# Patient Record
Sex: Female | Born: 1950 | Race: White | Hispanic: No | Marital: Married | Smoking: Never smoker
Health system: Southern US, Community
[De-identification: ages and names within clinical notes are randomized; demographics above are authoritative.]

---

## 2018-06-06 ENCOUNTER — Other Ambulatory Visit: Payer: Self-pay

## 2018-06-06 ENCOUNTER — Encounter: Payer: Self-pay | Admitting: Emergency Medicine

## 2018-06-06 ENCOUNTER — Emergency Department (INDEPENDENT_AMBULATORY_CARE_PROVIDER_SITE_OTHER)
Admission: EM | Admit: 2018-06-06 | Discharge: 2018-06-06 | Disposition: A | Discharge status: Home / Self Care | Payer: Medicare HMO | Source: Home / Self Care | Attending: Family Medicine | Admitting: Family Medicine

## 2018-06-06 ENCOUNTER — Emergency Department (INDEPENDENT_AMBULATORY_CARE_PROVIDER_SITE_OTHER): Payer: Medicare HMO

## 2018-06-06 DIAGNOSIS — M1711 Unilateral primary osteoarthritis, right knee: Secondary | ICD-10-CM

## 2018-06-06 DIAGNOSIS — M7701 Medial epicondylitis, right elbow: Secondary | ICD-10-CM

## 2018-06-06 DIAGNOSIS — M25521 Pain in right elbow: Secondary | ICD-10-CM

## 2018-06-06 DIAGNOSIS — M25561 Pain in right knee: Secondary | ICD-10-CM | POA: Diagnosis not present

## 2018-06-06 MED ORDER — MELOXICAM 7.5 MG PO TABS: 7.5000 mg | ORAL_TABLET | Freq: Every day | ORAL | 0 refills | Status: DC

## 2018-06-06 NOTE — ED Provider Notes (Signed)
[facility]    CSN: [id number] Arrival date & time: 06/06/18  1543     History   Chief Complaint Chief Complaint  Patient presents with  . Knee Pain  . Elbow Pain    HPI Deanna Keith is a 67 y.o. female.   HPI Deanna Keith is a 67 y.o. female presenting to UC with c/o Right elbow and Right knee pain for about 1 month. Aching and sore, mild to moderate in severity.  Knee pain worse with weightbearing.  No known injury.  She is Right hand dominant.  She has used ice and ibuprofen with mild temporary relief.  Denies redness or swelling at joints.    History reviewed. No pertinent past medical history.  There are no active problems to display for this patient.   History reviewed. No pertinent surgical history.  OB History   None      Home Medications    Prior to Admission medications   Medication Sig Start Date End Date Taking? Authorizing Provider  meloxicam (MOBIC) 7.5 MG tablet Take 1-2 tablets (7.5-15 mg total) by mouth daily. 06/06/18   Lurene ShadowPhelps, Iness Pangilinan O, PA-C    Family History No family history on file.  Social History Social History   Tobacco Use  . Smoking status: Never Smoker  . Smokeless tobacco: Never Used  Substance Use Topics  . Alcohol use: Not Currently  . Drug use: Not on file     Allergies   Patient has no known allergies.   Review of Systems Review of Systems  Musculoskeletal: Positive for arthralgias and myalgias. Negative for joint swelling.  Skin: Negative for color change, rash and wound.  Neurological: Negative for weakness and numbness.     Physical Exam Triage Vital Signs ED Triage Vitals  Enc Vitals Group     BP 06/06/18 1614 134/79     Pulse Rate 06/06/18 1614 72     Resp 06/06/18 1614 18     Temp 06/06/18 1614 97.8 F (36.6 C)     Temp Source 06/06/18 1614 Oral     SpO2 06/06/18 1614 98 %     Weight 06/06/18 1615 190 lb (86.2 kg)     Height 06/06/18 1615 5\' 6"  (1.676 m)     Head Circumference --    Peak Flow --      Pain Score 06/06/18 1614 4     Pain Loc --      Pain Edu? --      Excl. in GC? --    No data found.  Updated Vital Signs BP 134/79   Pulse 72   Temp 97.8 F (36.6 C) (Oral)   Resp 18   Ht 5\' 6"  (1.676 m)   Wt 190 lb (86.2 kg)   SpO2 98%   BMI 30.67 kg/m   Visual Acuity Right Eye Distance:   Left Eye Distance:   Bilateral Distance:    Right Eye Near:   Left Eye Near:    Bilateral Near:     Physical Exam  Constitutional: She is oriented to person, place, and time. She appears well-developed and well-nourished. No distress.  HENT:  Head: Normocephalic and atraumatic.  Eyes: EOM are normal.  Neck: Normal range of motion.  Cardiovascular: Normal rate.  Pulses:      Radial pulses are 2+ on the right side.  Pulmonary/Chest: Effort normal.  Musculoskeletal: Normal range of motion. She exhibits tenderness. She exhibits no edema.  Right elbow: no obvious edema or deformity.  Full ROM. Tenderness over medial epicondyle.  Right knee: no obvious edema or deformity. Tenderness to medial aspect.  Full ROM, no crepitus.   Neurological: She is alert and oriented to person, place, and time.  Skin: Skin is warm and dry. Capillary refill takes less than 2 seconds. She is not diaphoretic.  Right elbow and knee: skin in tact. No ecchymosis or erytehma.   Psychiatric: She has a normal mood and affect. Her behavior is normal.  Nursing note and vitals reviewed.    UC Treatments / Results  Labs (all labs ordered are listed, but only abnormal results are displayed) Labs Reviewed - No data to display  EKG None  Radiology Dg Elbow Complete Right (3+view)  Result Date: 06/06/2018 CLINICAL DATA:  Right elbow pain after injury several months ago. EXAM: RIGHT ELBOW - COMPLETE 3+ VIEW COMPARISON:  None. FINDINGS: There is no evidence of fracture, dislocation, or joint effusion. There is no evidence of arthropathy or other focal bone abnormality. Soft tissues are  unremarkable. IMPRESSION: Normal right elbow. Electronically Signed   By: Lupita Raider, M.D.   On: 06/06/2018 17:14   Dg Knee Complete 4 Views Right  Result Date: 06/06/2018 CLINICAL DATA:  Right knee pain after injury several months ago. EXAM: RIGHT KNEE - COMPLETE 4+ VIEW COMPARISON:  None. FINDINGS: No evidence of fracture, dislocation, or joint effusion. Mild narrowing of medial joint space is noted. Soft tissues are unremarkable. IMPRESSION: Mild degenerative joint disease is noted medially. No acute abnormality seen in the right knee. Electronically Signed   By: Lupita Raider, M.D.   On: 06/06/2018 17:16    Procedures Procedures (including critical care time)  Medications Ordered in UC Medications - No data to display  Initial Impression / Assessment and Plan / UC Course  I have reviewed the triage vital signs and the nursing notes.  Pertinent labs & imaging results that were available during my care of the patient were reviewed by me and considered in my medical decision making (see chart for details).     Discussed imaging with pt. Pain likely due to mild arthritis  Arm strap for elbow and knee sleeve provided for comfort.  Final Clinical Impressions(s) / UC Diagnoses   Final diagnoses:  Right elbow pain  Medial epicondylitis of elbow, right  Osteoarthritis of right knee, unspecified osteoarthritis type     Discharge Instructions      Meloxicam (Mobic) is an antiinflammatory to help with pain and inflammation.  Do not take ibuprofen, Advil, Aleve, or any other medications that contain NSAIDs while taking meloxicam as this may cause stomach upset or even ulcers if taken in large amounts for an extended period of time.   Please call to schedule a follow up appointment with Sports Medicine in 1-2 weeks if not improving, sooner if worsening.     ED Prescriptions    Medication Sig Dispense Auth. Provider   meloxicam (MOBIC) 7.5 MG tablet Take 1-2 tablets (7.5-15 mg  total) by mouth daily. 20 tablet Lurene Shadow, PA-C     Controlled Substance Prescriptions [place] Controlled Substance Registry consulted? Not Applicable   Rolla Plate 06/07/18 3664

## 2018-06-06 NOTE — Discharge Instructions (Signed)
°  Meloxicam (Mobic) is an antiinflammatory to help with pain and inflammation.  Do not take ibuprofen, Advil, Aleve, or any other medications that contain NSAIDs while taking meloxicam as this may cause stomach upset or even ulcers if taken in large amounts for an extended period of time.   Please call to schedule a follow up appointment with Sports Medicine in 1-2 weeks if not improving, sooner if worsening.

## 2018-06-06 NOTE — ED Triage Notes (Signed)
Patient reports pain in right knee and right elbow; uncertain of start date or specific injury but guesses it's been about a month. Takes ibuprofen and has tried icing.

## 2018-06-08 ENCOUNTER — Telehealth: Payer: Self-pay | Admitting: Emergency Medicine

## 2018-06-08 NOTE — Telephone Encounter (Signed)
Attempted to contact patient, left a message on voicemail.

## 2018-06-13 ENCOUNTER — Ambulatory Visit (INDEPENDENT_AMBULATORY_CARE_PROVIDER_SITE_OTHER): Payer: Medicare HMO | Admitting: Family Medicine

## 2018-06-13 ENCOUNTER — Encounter: Payer: Self-pay | Admitting: Family Medicine

## 2018-06-13 DIAGNOSIS — M7701 Medial epicondylitis, right elbow: Secondary | ICD-10-CM

## 2018-06-13 DIAGNOSIS — M179 Osteoarthritis of knee, unspecified: Secondary | ICD-10-CM | POA: Insufficient documentation

## 2018-06-13 DIAGNOSIS — M1710 Unilateral primary osteoarthritis, unspecified knee: Secondary | ICD-10-CM | POA: Insufficient documentation

## 2018-06-13 DIAGNOSIS — M25561 Pain in right knee: Secondary | ICD-10-CM

## 2018-06-13 DIAGNOSIS — M1711 Unilateral primary osteoarthritis, right knee: Secondary | ICD-10-CM

## 2018-06-13 MED ORDER — DICLOFENAC SODIUM 1 % TD GEL: 4.0000 g | Freq: Four times a day (QID) | TRANSDERMAL | 11 refills | Status: DC

## 2018-06-13 NOTE — Patient Instructions (Signed)
Thank you for coming in today. Use the diclofenac gel on the knees 4x daily Do the straight leg raises and use an exercise bike. 30 2x daily Wean off of meloxicam   For the elbow do the elbow straight hand pull down stretch Do the elbow straight hand weight up to down slowly exercises  30 2x daily  Recheck with me in 6 weeks.  Return sooner if needed.

## 2018-06-13 NOTE — Progress Notes (Signed)
Subjective:    CC: Knee pain and elbow pain  HPI: Ms. Deanna Keith has right medial elbow pain and bilateral knee pain right worse than left.  Right elbow pain: Present for about 1 month.  Pain located at the medial aspect of the elbow and worse with wrist activity.  She is been doing more gardening recently and thinks it may be the cause.  She denies any injury.  She denies any radiating pain weakness or numbness.  She was seen in urgent care and was prescribed meloxicam which has helped a bit.  Additionally she was given a counterforce brace which she notes is not very helpful.  Patient has been placed in the counterforce brace directly over her medial epicondyle.  Additionally she is tried some ibuprofen which did not help much at all.  Bilateral knee pain right worse than left.  Pain ongoing for about 2 months.  Pain has occurred without injury.  She points to her medial knee bilaterally in her right anterior knee as the location of the majority of her pain.  She denies any locking or catching.  She does occasionally note popping.  She denies significant swelling.  Again she is tried meloxicam and ibuprofen which helped a bit.  She had x-rays in urgent care which did not show fracture but did show mild amount of arthritis in her right knee.  Past medical history, Surgical history, Family history not pertinant except as noted below, Social history, Allergies, and medications have been entered into the medical record, reviewed, and no changes needed.   Review of Systems: No headache, visual changes, nausea, vomiting, diarrhea, constipation, dizziness, abdominal pain, skin rash, fevers, chills, night sweats, weight loss, swollen lymph nodes, body aches, joint swelling, muscle aches, chest pain, shortness of breath, mood changes, visual or auditory hallucinations.   Objective:    Vitals:   06/13/18 1012  BP: (!) 143/64  Pulse: 68   General: Well Developed, well nourished, and in no acute  distress.  Neuro/Psych: Alert and oriented x3, extra-ocular muscles intact, able to move all 4 extremities, sensation grossly intact. Skin: Warm and dry, no rashes noted.  Respiratory: Not using accessory muscles, speaking in full sentences, trachea midline.  Cardiovascular: Pulses palpable, no extremity edema. Abdomen: Does not appear distended. MSK:  Right elbow normal-appearing no deformity normal motion. Mildly tender to palpation medial epicondyle.  Pain with resisted wrist flexion.  Pain with resisted wrist pronation. Normal elbow flexion and extension range of motion and strength.  Left elbow normal-appearing nontender normal motion normal strength.  Right knee: Normal-appearing without effusion or erythema. Range of motion 0-120 degrees with minimal retropatellar crepitations. Mild degenerative palpation medial joint line and at the distal patella tendon. Stable ligaments exam to anterior posterior drawer test and valgus varus stress test. Mildly positive medial McMurray's test. Extension strength slightly decreased 4/5 limited by pain. Flexion strength intact  Left knee: Normal appearing.  ROM 0-120 deg with mild crepitations.  TTP medial joint line mildly. Stable ligament exam.  Negative McMurray test.  Intact flexion and extension strength without pain.   Pulses and cap refill intact BL LE   Lab and Radiology Results EXAM: RIGHT KNEE - COMPLETE 4+ VIEW  COMPARISON:  None.  FINDINGS: No evidence of fracture, dislocation, or joint effusion. Mild narrowing of medial joint space is noted. Soft tissues are unremarkable.  IMPRESSION: Mild degenerative joint disease is noted medially. No acute abnormality seen in the right knee.   Electronically Signed  By: Lupita Raider, M.D.   On: 06/06/2018 17:16  EXAM: RIGHT ELBOW - COMPLETE 3+ VIEW  COMPARISON:  None.  FINDINGS: There is no evidence of fracture, dislocation, or joint effusion. There is no  evidence of arthropathy or other focal bone abnormality. Soft tissues are unremarkable.  IMPRESSION: Normal right elbow.   Electronically Signed   By: Lupita Raider, M.D.   On: 06/06/2018 17:14 I personally (independently) visualized and performed the interpretation of the images attached in this note.   Impression and Recommendations:    Assessment and Plan: 67 y.o. female with  Right elbow pain: Medial epicondylitis.  Plan for home exercise program improved positioning of counterforce brace over muscle belly not over epicondyle and diclofenac gel.  Recheck in 6 weeks.  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  Plan for quadricep strengthening, and diclofenac gel trial.  If not improving next step would be steroid injection.  Recheck in 6 weeks.  Return sooner if needed..   No orders of the defined types were placed in this encounter.  Meds ordered this encounter  Medications  . diclofenac sodium (VOLTAREN) 1 % GEL    Sig: Apply 4 g topically 4 (four) times daily. To knees bilaterally    Dispense:  400 g    Refill:  11    Patient tried and failed ibuprofen and meloxicam. Djd knees    Discussed warning signs or symptoms. Please see discharge instructions. Patient expresses understanding.

## 2018-07-03 ENCOUNTER — Telehealth: Payer: Self-pay

## 2018-07-03 ENCOUNTER — Telehealth: Payer: Self-pay | Admitting: Family Medicine

## 2018-07-03 NOTE — Telephone Encounter (Signed)
We will complete paperwork once we receive it.  In the meantime patient can purchase this herself using a good Rx coupon.  This should be $22 a tube at [facility].

## 2018-07-03 NOTE — Telephone Encounter (Signed)
Patient came in to inquire about the PA on her diclofenac. I explained the PA process to her, and she stated that she talked to her insurance almost 3 weeks ago about getting the medication approved. I informed her that we have not seen any paperwork from her insurance requesting additional information and instructed her to call her insurance again to have them fax us the forms. She stated that she would do so.   In the meantime, would it be possible to send in a prescription for Meloxicam? She was prescribed the medication in UrgentCare and was supposed to taper off of it, but she has recently ran out of the med and is still hurting. Please advise. Thanks!

## 2018-07-03 NOTE — Telephone Encounter (Signed)
Patient called stated that her insurance will not cover Diclofenac she stated that paperwork was sent over by her pharmacy to complete. She stated that a PA needs to be done. Please advise and call the patient to let her know what's going on. Routing encounter to [place] as well since she does PA;s.  Rhonda Cunningham,CMA

## 2018-07-04 MED ORDER — MELOXICAM 7.5 MG PO TABS: 7.5000 mg | ORAL_TABLET | Freq: Every day | ORAL | 0 refills | Status: DC

## 2018-07-04 NOTE — Telephone Encounter (Signed)
Meloxicam refilled  

## 2018-07-04 NOTE — Telephone Encounter (Signed)
Left detailed message on patient vm with this information,. Advised patient to call the office back and ask for triage if she had further questions. Denika Krone,CMA

## 2018-07-04 NOTE — Telephone Encounter (Signed)
Patient notified that med sent to pharmacy. KG LPN

## 2018-07-21 ENCOUNTER — Ambulatory Visit (INDEPENDENT_AMBULATORY_CARE_PROVIDER_SITE_OTHER): Payer: Medicare HMO | Admitting: Family Medicine

## 2018-07-21 ENCOUNTER — Encounter: Payer: Self-pay | Admitting: Family Medicine

## 2018-07-21 ENCOUNTER — Telehealth: Payer: Self-pay | Admitting: Family Medicine

## 2018-07-21 VITALS — BP 132/66 | HR 74 | Ht 66.0 in | Wt 203.0 lb

## 2018-07-21 DIAGNOSIS — M1711 Unilateral primary osteoarthritis, right knee: Secondary | ICD-10-CM

## 2018-07-21 DIAGNOSIS — M7701 Medial epicondylitis, right elbow: Secondary | ICD-10-CM | POA: Diagnosis not present

## 2018-07-21 DIAGNOSIS — M25561 Pain in right knee: Secondary | ICD-10-CM

## 2018-07-21 MED ORDER — MELOXICAM 7.5 MG PO TABS: 7.5000 mg | ORAL_TABLET | Freq: Every day | ORAL | 2 refills | Status: AC

## 2018-07-21 MED ORDER — DICLOFENAC SODIUM 1 % TD GEL: 4.0000 g | Freq: Four times a day (QID) | TRANSDERMAL | 11 refills | Status: AC

## 2018-07-21 NOTE — Progress Notes (Signed)
Deanna Keith is a 67 y.o. female who presents to [facility] today for knee pain.   Bilateral knee pain: her pain has persisted despite taking meloxicam daily. She says the pain comes and goes but is present in both knees. The pain is diffuse. She says that she has pain at rest and when laying in bed which worries her as it used to only be with weightbearing. She was unable to get her diclofenac gel due to insurance issues but would like to get it. She is not interested in steroid injections at this time.   Elbow pain: her right elbow pain secondary to medial epicondylitis has resolved with home exercises.     ROS:  As above  Exam:  BP 132/66   Pulse 74   Ht 5\' 6"  (1.676 m)   Wt 203 lb (92.1 kg)   BMI 32.77 kg/m  General: Well Developed, well nourished, and in no acute distress.  Neuro/Psych: Alert and oriented x3, extra-ocular muscles intact, able to move all 4 extremities, sensation grossly intact. Skin: Warm and dry, no rashes noted.  Respiratory: Not using accessory muscles, speaking in full sentences, trachea midline.  Cardiovascular: Pulses palpable, no extremity edema. Abdomen: Does not appear distended. Knees: normal appearing with no obvious deformities bilaterally  Non tender to palpation diffusely bilaterally Normal range of motion with crepitations bilaterally  Strength 5/5 with flexion and extension bilaterally  No pain or laxity to varus/valgus bilaterally     Lab and Radiology Results CLINICAL DATA:  Right knee pain after injury several months ago.  EXAM: RIGHT KNEE - COMPLETE 4+ VIEW  COMPARISON:  None.  FINDINGS: No evidence of fracture, dislocation, or joint effusion. Mild narrowing of medial joint space is noted. Soft tissues are unremarkable.  IMPRESSION: Mild degenerative joint disease is noted medially. No acute abnormality seen in the right knee.   Electronically Signed   By: Lupita Raider, M.D.   On: 06/06/2018 17:16  Assessment and Plan: 67 y.o. female with bilateral knee pain secondary to osteoarthritis. She has joint degeneration seen on her x ray from July. She has been taking meloxicam which has only provided minimal relief. The plan will be to add diclofenac gel as attempted at the last visit. She should also continue to take the meloxicam. If this does not improve the symptoms we can consider injections in the future.   Her elbow pain has resolved and she was advised to repeat the home exercises if her pain returns.   The patient left eventually got to the right phone number of Medicare part the medication phone line [phone] Medication approved until next year.  New prescription for diclofenac gel since.  No orders of the defined types were placed in this encounter.  Meds ordered this encounter  Medications  . meloxicam (MOBIC) 7.5 MG tablet    Sig: Take 1-2 tablets (7.5-15 mg total) by mouth daily.    Dispense:  30 tablet    Refill:  2  . diclofenac sodium (VOLTAREN) 1 % GEL    Sig: Apply 4 g topically 4 (four) times daily. To knees bilaterally    Dispense:  1000 g    Refill:  11    Approved via prior auth   I spent 15 minutes with this patient, greater than 50% was face-to-face time counseling regarding ddx and plan.   Discussed warning signs or symptoms. Please see discharge instructions. Patient expresses understanding.  I personally was present  and performed or re-performed the history, physical exam and medical decision-making activities of this service and have verified that the service and findings are accurately documented in the student's note. ___________________________________________ Clementeen Graham M.D., ABFM., CAQSM. Primary Care and Sports Medicine Adjunct Instructor of Family Medicine  [employer]

## 2018-07-21 NOTE — Telephone Encounter (Signed)
Per previous note its approved. I did not receive any information about this medication.

## 2018-07-21 NOTE — Telephone Encounter (Signed)
After phone several phone calls we eventually got to the Medicare part D authorization phone number [phone] Medicine has been approved

## 2018-07-21 NOTE — Telephone Encounter (Signed)
Is this something you were working on? I see where in his last OV note he was discussing diclofenac gel.

## 2018-07-21 NOTE — Patient Instructions (Addendum)
Thank you for coming in today.  Prior auth Diclofenac gel  Provider line [phone]  We will continue to work trying to get the diclofenac gel.  They may be looking for a 3 medicine trial.   I should get a response to you soon.   We can continue the home exercises.   We can also consider gel shots or steroid injections in the future.

## 2018-07-23 NOTE — Telephone Encounter (Signed)
Pharmacy calls the patient about the refill.

## 2018-07-23 NOTE — Telephone Encounter (Signed)
Was this patient called about this?  Chaz Ronning,CMA

## 2019-10-26 DIAGNOSIS — H524 Presbyopia: Secondary | ICD-10-CM | POA: Diagnosis not present

## 2019-10-26 IMAGING — DX DG KNEE COMPLETE 4+V*R*
4 series · 4 of 4 positions shown · non-contrast
Comparison: None.

CLINICAL DATA: Right knee pain after injury several months ago.

EXAM:
RIGHT KNEE - COMPLETE 4+ VIEW

[knee ap]
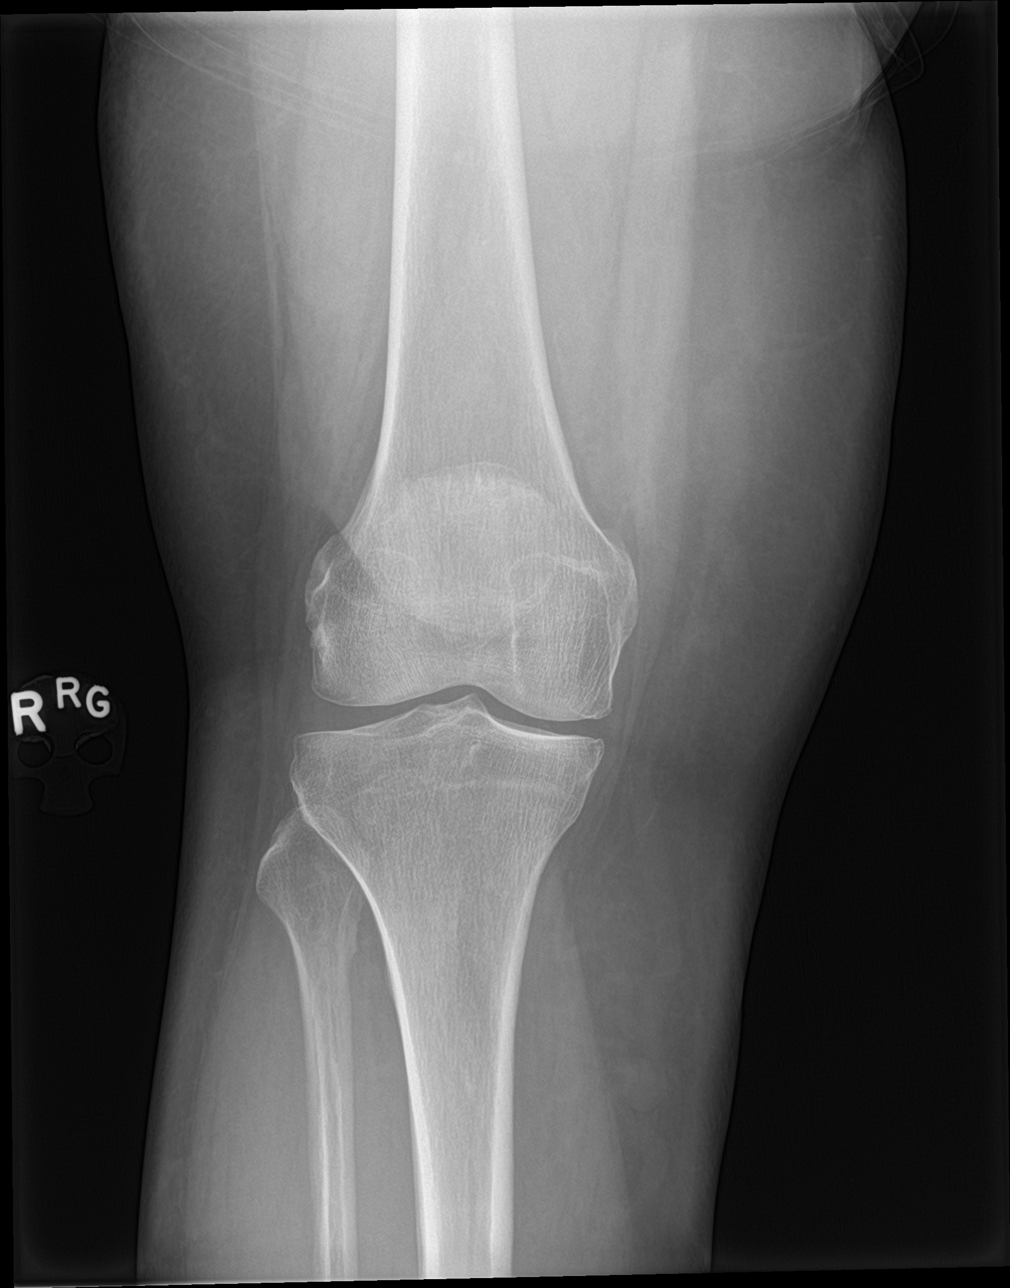

[tunnel]
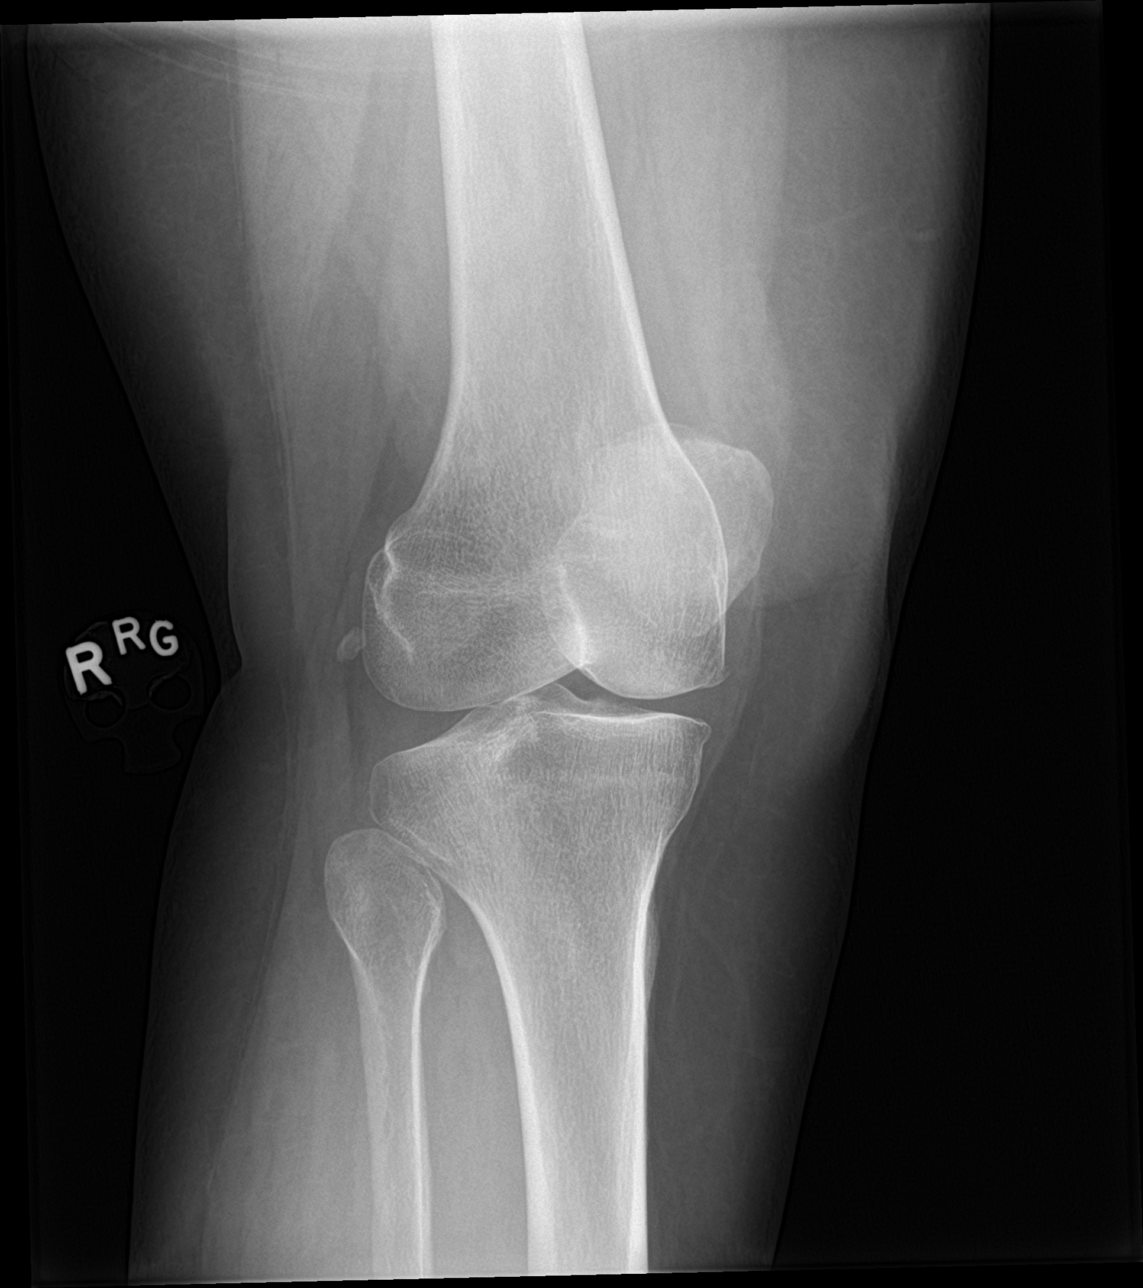

[knee lat]
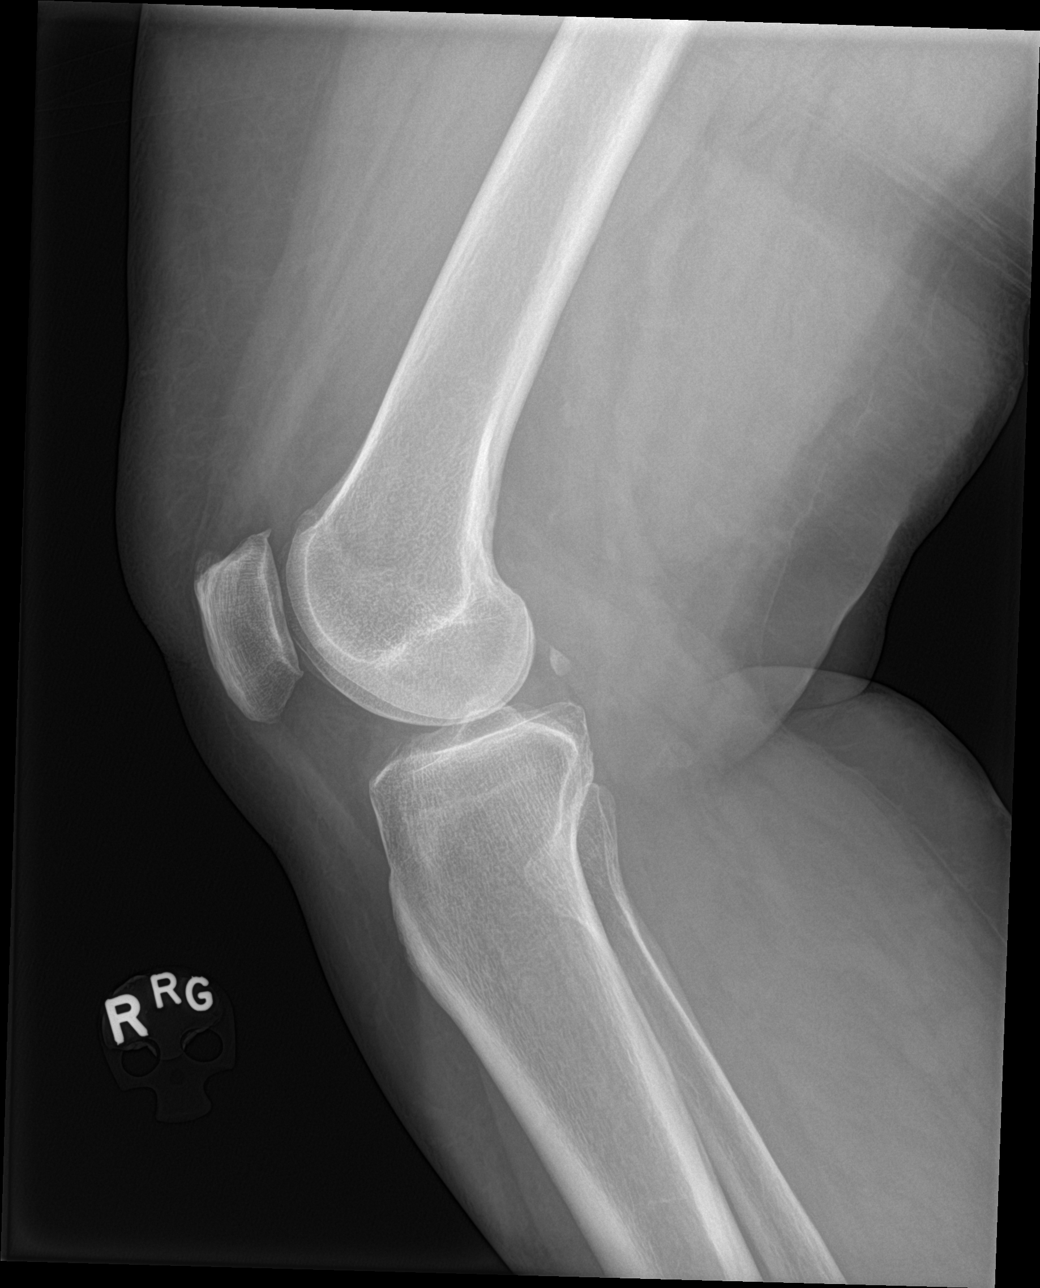

[knee obl]
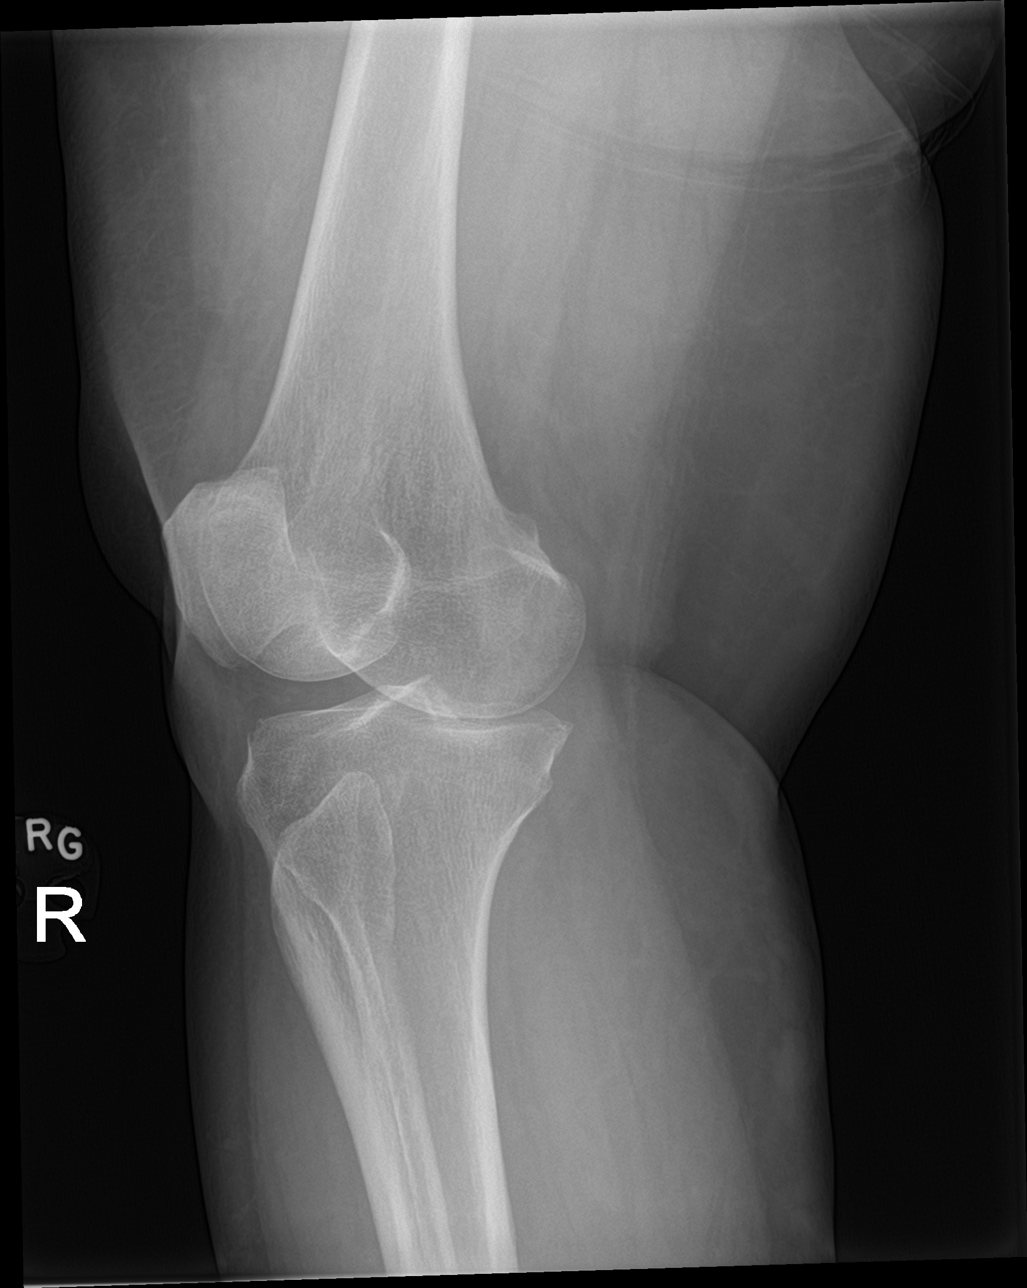

[4 of 4 positions shown; findings below may reference images not displayed]

FINDINGS: No evidence of fracture, dislocation, or joint effusion. Mild
narrowing of medial joint space is noted. Soft tissues are
unremarkable.
IMPRESSION: Mild degenerative joint disease is noted medially. No acute
abnormality seen in the right knee.

## 2019-10-26 IMAGING — DX DG ELBOW COMPLETE 3+V*R*
4 series · 4 of 4 positions shown · non-contrast
Comparison: None.

CLINICAL DATA: Right elbow pain after injury several months ago.

EXAM:
RIGHT ELBOW - COMPLETE 3+ VIEW

[elbow ap]
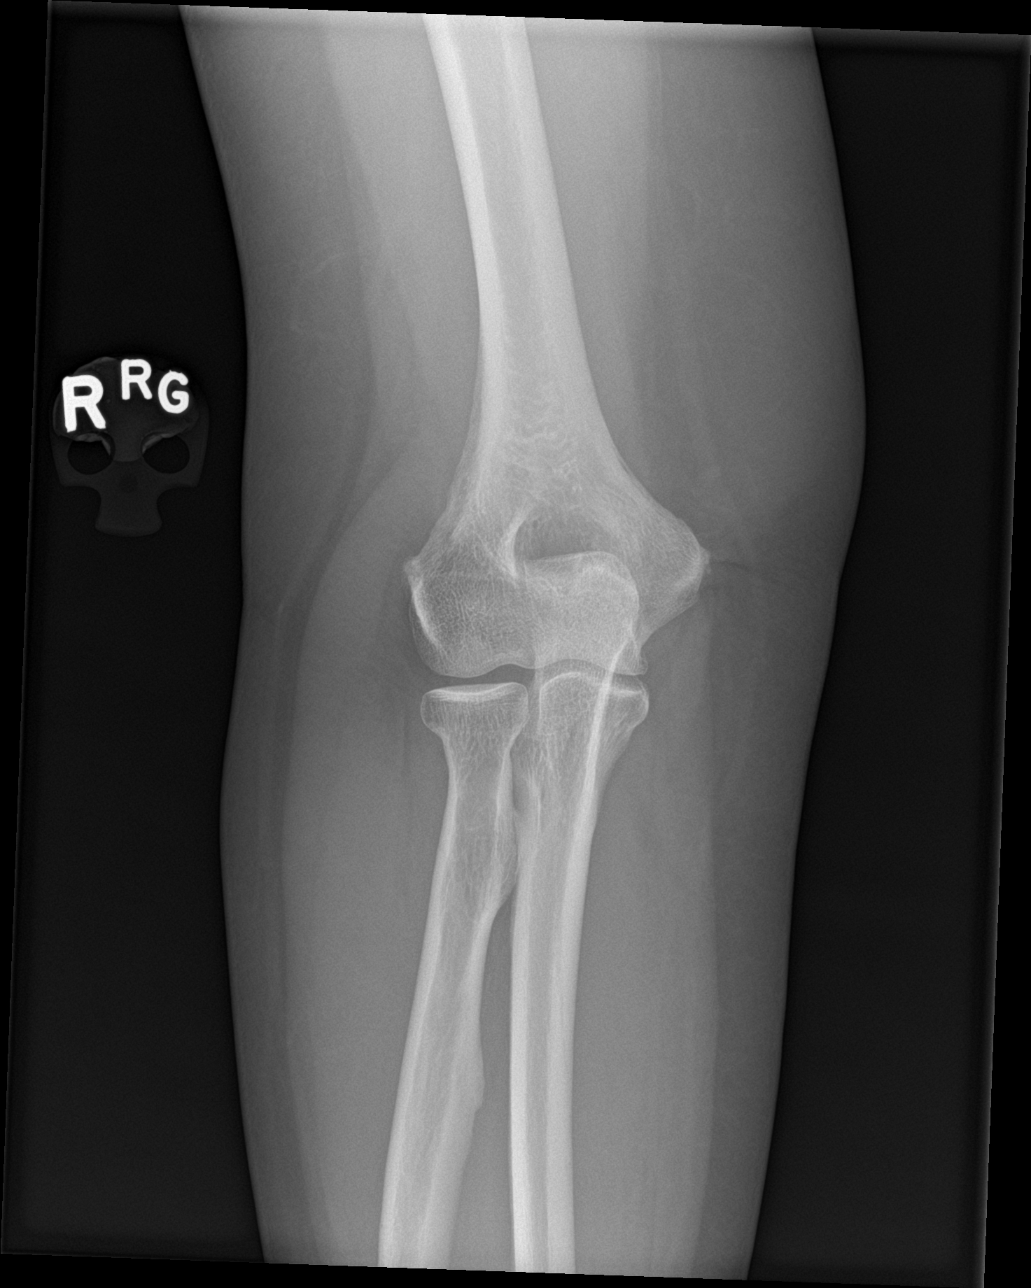

[elbow obl (1 of 2)]
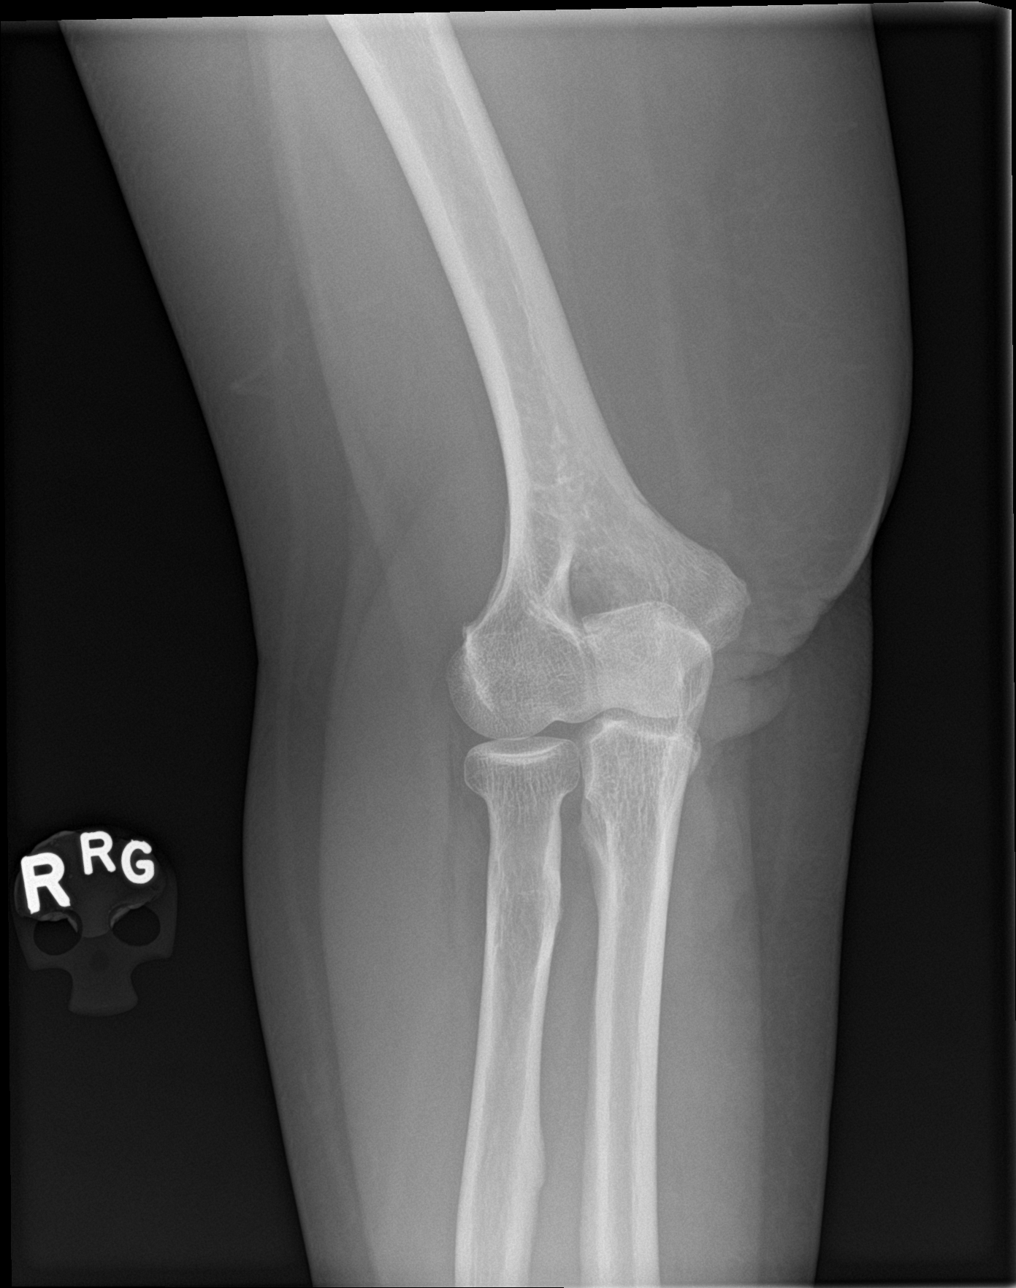

[elbow lat]
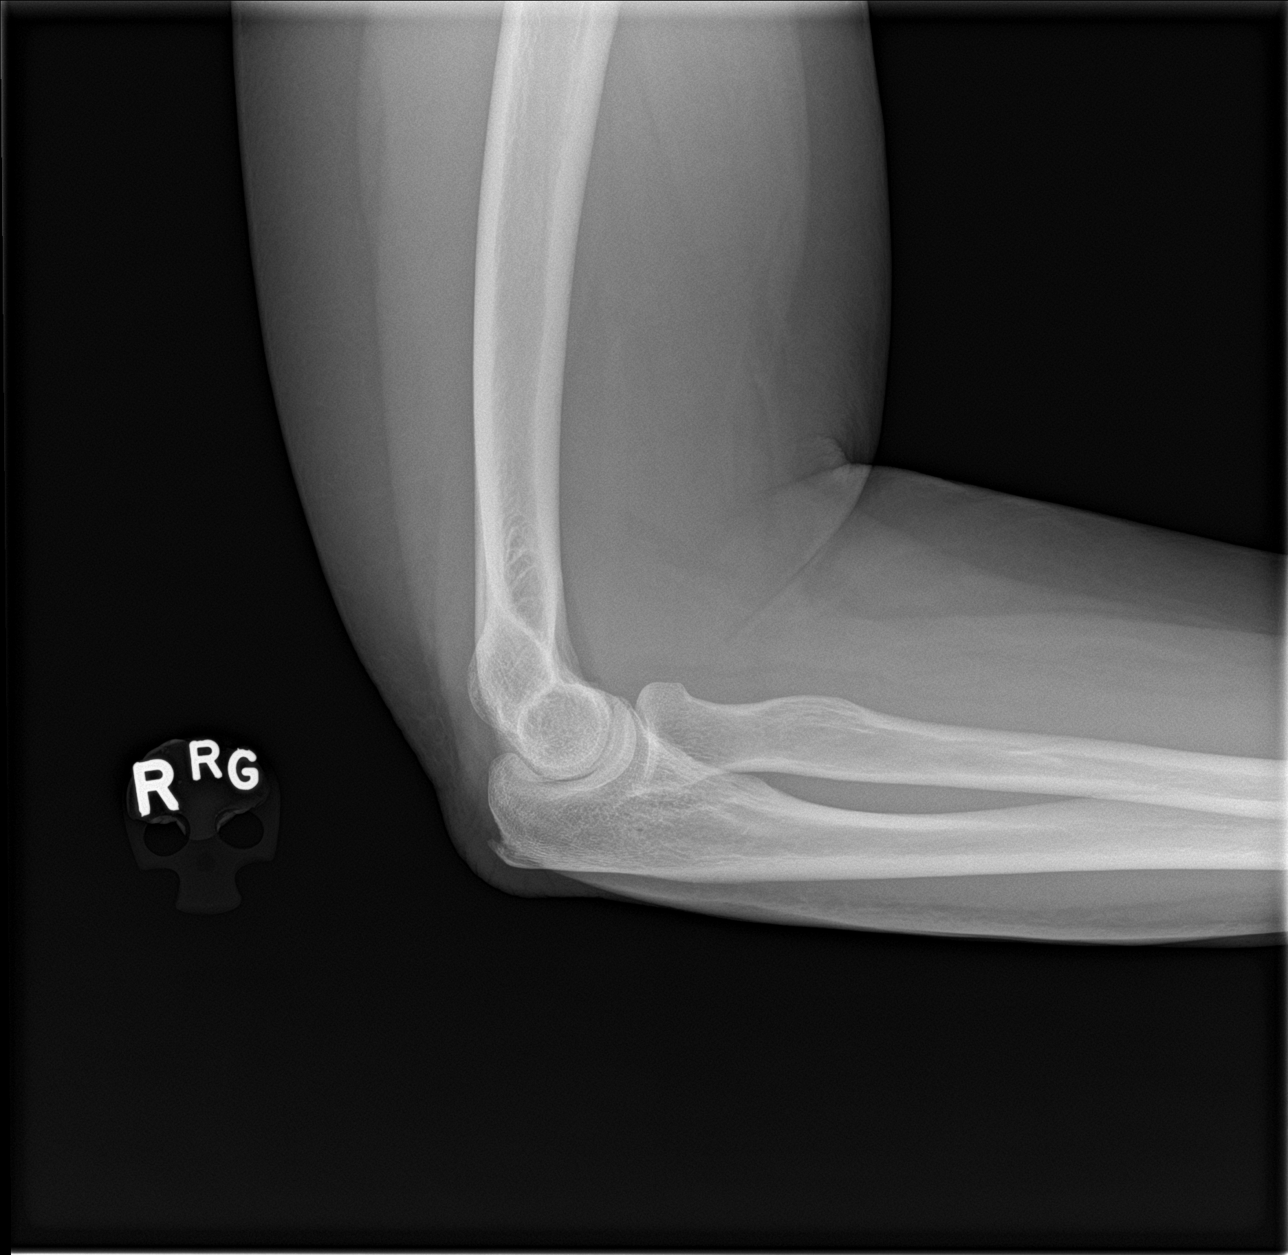

[elbow obl (2 of 2)]
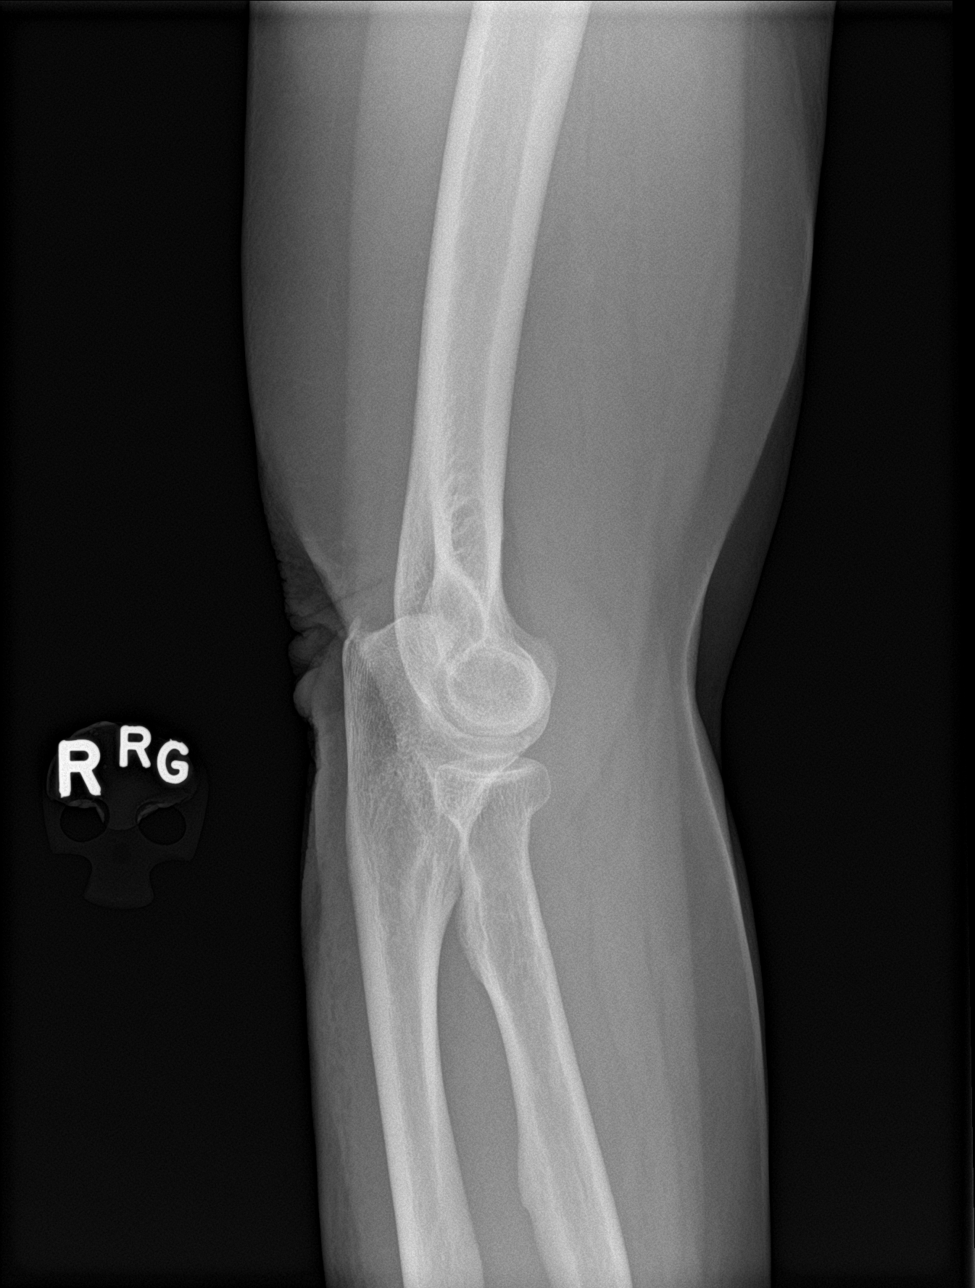

[4 of 4 positions shown; findings below may reference images not displayed]

FINDINGS: There is no evidence of fracture, dislocation, or joint effusion.
There is no evidence of arthropathy or other focal bone abnormality.
Soft tissues are unremarkable.
IMPRESSION: Normal right elbow.

## 2020-07-21 DIAGNOSIS — Z0100 Encounter for examination of eyes and vision without abnormal findings: Secondary | ICD-10-CM | POA: Diagnosis not present
